# Patient Record
Sex: Male | Born: 1975 | Hispanic: Yes | Marital: Married
Health system: Southern US, Community
[De-identification: ages and names within clinical notes are randomized; demographics above are authoritative.]

---

## 2008-05-10 ENCOUNTER — Emergency Department: Payer: Self-pay | Admitting: Emergency Medicine

## 2020-04-24 ENCOUNTER — Emergency Department: Payer: Worker's Compensation

## 2020-04-24 ENCOUNTER — Emergency Department
Admission: EM | Admit: 2020-04-24 | Discharge: 2020-04-24 | Disposition: A | Discharge status: Home / Self Care | Payer: Worker's Compensation | Attending: Emergency Medicine | Admitting: Emergency Medicine

## 2020-04-24 ENCOUNTER — Other Ambulatory Visit: Payer: Self-pay

## 2020-04-24 DIAGNOSIS — S0990XA Unspecified injury of head, initial encounter: Secondary | ICD-10-CM | POA: Diagnosis not present

## 2020-04-24 DIAGNOSIS — W268XXA Contact with other sharp object(s), not elsewhere classified, initial encounter: Secondary | ICD-10-CM | POA: Diagnosis not present

## 2020-04-24 DIAGNOSIS — S0083XA Contusion of other part of head, initial encounter: Secondary | ICD-10-CM | POA: Insufficient documentation

## 2020-04-24 DIAGNOSIS — Y9289 Other specified places as the place of occurrence of the external cause: Secondary | ICD-10-CM | POA: Diagnosis not present

## 2020-04-24 DIAGNOSIS — Y9389 Activity, other specified: Secondary | ICD-10-CM | POA: Insufficient documentation

## 2020-04-24 DIAGNOSIS — Y990 Civilian activity done for income or pay: Secondary | ICD-10-CM | POA: Diagnosis not present

## 2020-04-24 DIAGNOSIS — Z23 Encounter for immunization: Secondary | ICD-10-CM | POA: Diagnosis not present

## 2020-04-24 DIAGNOSIS — S0993XA Unspecified injury of face, initial encounter: Secondary | ICD-10-CM | POA: Diagnosis present

## 2020-04-24 MED ORDER — TETANUS-DIPHTH-ACELL PERTUSSIS 5-2.5-18.5 LF-MCG/0.5 IM SUSP
0.5000 mL | Freq: Once | INTRAMUSCULAR | Status: AC
  Administered 2020-04-24: 0.5 mL via INTRAMUSCULAR
  Filled 2020-04-24: qty 0.5

## 2020-04-24 MED ORDER — IBUPROFEN 600 MG PO TABS: 600.0000 mg | ORAL_TABLET | Freq: Four times a day (QID) | ORAL | 0 refills | Status: AC | PRN

## 2020-04-24 MED ORDER — ACETAMINOPHEN 500 MG PO TABS
1000.0000 mg | ORAL_TABLET | Freq: Once | ORAL | Status: AC
  Administered 2020-04-24: 1000 mg via ORAL
  Filled 2020-04-24: qty 2

## 2020-04-24 NOTE — ED Notes (Signed)
Pt transported to CT ?

## 2020-04-24 NOTE — ED Notes (Signed)
No workmans comp profile on file.  Pt works at [employer].  Called [phone], spoke to the owner, Gaspar Bidding.  Mr Tony Stein confirms the patient is an employee and he states there is no workmans comp requirements, UDS or Alcohol levels that need to be done.
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
## 2020-04-24 NOTE — Discharge Instructions (Signed)
Please take Tylenol and ibuprofen as needed for pain.  Apply ice to the left cheek as needed.  Return to the ER for any vision changes, headaches, nausea vomiting or any worsening symptoms or urgent changes in health.
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
